# Patient Record
Sex: Female | Born: 1970 | Race: White | Hispanic: No | Marital: Married | Smoking: Current every day smoker
Health system: Southern US, Community
[De-identification: ages and names within clinical notes are randomized; demographics above are authoritative.]

## PROBLEM LIST (undated history)

## (undated) DIAGNOSIS — R002 Palpitations: Secondary | ICD-10-CM

## (undated) DIAGNOSIS — J302 Other seasonal allergic rhinitis: Secondary | ICD-10-CM

## (undated) HISTORY — PX: CHOLECYSTECTOMY: SHX55

---

## 2006-04-14 ENCOUNTER — Ambulatory Visit: Payer: Self-pay | Admitting: Family Medicine

## 2006-06-15 ENCOUNTER — Ambulatory Visit: Payer: Self-pay | Admitting: Family Medicine

## 2006-10-11 ENCOUNTER — Emergency Department: Payer: Self-pay | Admitting: Emergency Medicine

## 2008-09-07 ENCOUNTER — Emergency Department: Payer: Self-pay | Admitting: Emergency Medicine

## 2009-03-07 ENCOUNTER — Ambulatory Visit: Payer: Self-pay | Admitting: Family Medicine

## 2009-05-06 ENCOUNTER — Ambulatory Visit: Payer: Self-pay | Admitting: Podiatry

## 2010-02-08 ENCOUNTER — Emergency Department: Payer: Self-pay | Admitting: Emergency Medicine

## 2010-02-17 ENCOUNTER — Ambulatory Visit: Payer: Self-pay | Admitting: Family Medicine

## 2011-08-27 ENCOUNTER — Ambulatory Visit: Payer: Self-pay | Admitting: Family Medicine

## 2011-08-31 ENCOUNTER — Ambulatory Visit: Payer: Self-pay | Admitting: Family Medicine

## 2011-08-31 DIAGNOSIS — I519 Heart disease, unspecified: Secondary | ICD-10-CM

## 2012-06-23 ENCOUNTER — Ambulatory Visit: Payer: Self-pay | Admitting: Internal Medicine

## 2012-12-11 ENCOUNTER — Ambulatory Visit: Payer: Self-pay | Admitting: Family Medicine

## 2012-12-12 ENCOUNTER — Ambulatory Visit: Payer: Self-pay | Admitting: Family Medicine

## 2013-03-21 ENCOUNTER — Ambulatory Visit: Payer: Self-pay | Admitting: Family Medicine

## 2013-05-31 ENCOUNTER — Ambulatory Visit: Payer: Self-pay | Admitting: Family Medicine

## 2013-10-16 ENCOUNTER — Ambulatory Visit: Payer: Self-pay | Admitting: Family Medicine

## 2014-05-21 ENCOUNTER — Ambulatory Visit: Payer: Self-pay | Admitting: Family Medicine

## 2016-03-11 ENCOUNTER — Encounter: Payer: Self-pay | Admitting: *Deleted

## 2016-03-15 ENCOUNTER — Ambulatory Visit: Payer: BLUE CROSS/BLUE SHIELD | Admitting: Anesthesiology

## 2016-03-15 ENCOUNTER — Encounter: Payer: Self-pay | Admitting: Anesthesiology

## 2016-03-15 ENCOUNTER — Ambulatory Visit
Admission: RE | Admit: 2016-03-15 | Discharge: 2016-03-15 | Disposition: A | Discharge status: Home / Self Care | Payer: BLUE CROSS/BLUE SHIELD | Source: Ambulatory Visit | Attending: Gastroenterology | Admitting: Gastroenterology

## 2016-03-15 ENCOUNTER — Encounter
Admission: RE | Disposition: A | Discharge status: Home / Self Care | Payer: Self-pay | Source: Ambulatory Visit | Attending: Gastroenterology

## 2016-03-15 DIAGNOSIS — Z885 Allergy status to narcotic agent status: Secondary | ICD-10-CM | POA: Insufficient documentation

## 2016-03-15 DIAGNOSIS — R1084 Generalized abdominal pain: Secondary | ICD-10-CM | POA: Diagnosis present

## 2016-03-15 DIAGNOSIS — D120 Benign neoplasm of cecum: Secondary | ICD-10-CM | POA: Insufficient documentation

## 2016-03-15 DIAGNOSIS — F17200 Nicotine dependence, unspecified, uncomplicated: Secondary | ICD-10-CM | POA: Insufficient documentation

## 2016-03-15 DIAGNOSIS — Z880 Allergy status to penicillin: Secondary | ICD-10-CM | POA: Insufficient documentation

## 2016-03-15 DIAGNOSIS — Z79899 Other long term (current) drug therapy: Secondary | ICD-10-CM | POA: Insufficient documentation

## 2016-03-15 DIAGNOSIS — R002 Palpitations: Secondary | ICD-10-CM | POA: Insufficient documentation

## 2016-03-15 DIAGNOSIS — K208 Other esophagitis: Secondary | ICD-10-CM | POA: Diagnosis not present

## 2016-03-15 DIAGNOSIS — K5730 Diverticulosis of large intestine without perforation or abscess without bleeding: Secondary | ICD-10-CM | POA: Diagnosis not present

## 2016-03-15 DIAGNOSIS — K2960 Other gastritis without bleeding: Secondary | ICD-10-CM | POA: Diagnosis not present

## 2016-03-15 DIAGNOSIS — K640 First degree hemorrhoids: Secondary | ICD-10-CM | POA: Diagnosis not present

## 2016-03-15 HISTORY — PX: ESOPHAGOGASTRODUODENOSCOPY (EGD) WITH PROPOFOL: SHX5813

## 2016-03-15 HISTORY — PX: COLONOSCOPY WITH PROPOFOL: SHX5780

## 2016-03-15 HISTORY — DX: Palpitations: R00.2

## 2016-03-15 HISTORY — DX: Other seasonal allergic rhinitis: J30.2

## 2016-03-15 LAB — POCT PREGNANCY, URINE: Preg Test, Ur: NEGATIVE

## 2016-03-15 SURGERY — COLONOSCOPY WITH PROPOFOL
Anesthesia: General

## 2016-03-15 MED ORDER — SODIUM CHLORIDE 0.9 % IV SOLN
INTRAVENOUS | Status: DC
  Administered 2016-03-15 (×2): via INTRAVENOUS

## 2016-03-15 MED ORDER — SODIUM CHLORIDE 0.9 % IV SOLN: INTRAVENOUS | Status: DC

## 2016-03-15 MED ORDER — PHENYLEPHRINE HCL 10 MG/ML IJ SOLN
INTRAMUSCULAR | Status: DC | PRN
  Administered 2016-03-15 (×3): 100 ug via INTRAVENOUS

## 2016-03-15 MED ORDER — PROPOFOL 500 MG/50ML IV EMUL
INTRAVENOUS | Status: DC | PRN
  Administered 2016-03-15: 180 ug/kg/min via INTRAVENOUS

## 2016-03-15 MED ORDER — MIDAZOLAM HCL 2 MG/2ML IJ SOLN
INTRAMUSCULAR | Status: DC | PRN
  Administered 2016-03-15: 2 mg via INTRAVENOUS

## 2016-03-15 MED ORDER — PROPOFOL 10 MG/ML IV BOLUS
INTRAVENOUS | Status: DC | PRN
  Administered 2016-03-15 (×2): 50 mg via INTRAVENOUS

## 2016-03-15 MED ORDER — LIDOCAINE HCL (CARDIAC) 20 MG/ML IV SOLN
INTRAVENOUS | Status: DC | PRN
  Administered 2016-03-15: 100 mg via INTRAVENOUS

## 2016-03-15 MED ORDER — FENTANYL CITRATE (PF) 100 MCG/2ML IJ SOLN
25.0000 ug | INTRAMUSCULAR | Status: DC | PRN
  Administered 2016-03-15: 50 ug via INTRAVENOUS

## 2016-03-15 MED ORDER — ONDANSETRON HCL 4 MG/2ML IJ SOLN: 4.0000 mg | Freq: Once | INTRAMUSCULAR | Status: DC | PRN

## 2016-03-15 NOTE — Transfer of Care (Signed)
Immediate Anesthesia Transfer of Care Note  Patient: Katie Cisneros  Procedure(s) Performed: Procedure(s): COLONOSCOPY WITH PROPOFOL (N/A) ESOPHAGOGASTRODUODENOSCOPY (EGD) WITH PROPOFOL (N/A)  Patient Location: PACU  Anesthesia Type:General  Level of Consciousness: sedated  Airway & Oxygen Therapy: Patient Spontanous Breathing and Patient connected to nasal cannula oxygen  Post-op Assessment: Report given to RN and Post -op Vital signs reviewed and stable  Post vital signs: Reviewed and stable  Last Vitals:  Vitals:   03/15/16 0942 03/15/16 1117  BP: 120/83 (!) 86/56  Pulse: (!) 115 86  Resp: 17 (!) 24  Temp: 37.1 C 36.2 C    Last Pain:  Vitals:   03/15/16 0942  TempSrc: Oral         Complications: No apparent anesthesia complications

## 2016-03-15 NOTE — Anesthesia Preprocedure Evaluation (Addendum)
Anesthesia Evaluation  Patient identified by MRN, date of birth, ID band Patient awake    Reviewed: Allergy & Precautions, NPO status , Patient's Chart, lab work & pertinent test results  Airway Mallampati: II  TM Distance: <3 FB     Dental no notable dental hx.  Fillings:   Pulmonary Current Smoker,  Seasonal allergies   Pulmonary exam normal        Cardiovascular negative cardio ROS Normal cardiovascular exam+ dysrhythmias      Neuro/Psych negative neurological ROS  negative psych ROS   GI/Hepatic negative GI ROS, Neg liver ROS,   Endo/Other  negative endocrine ROS  Renal/GU negative Renal ROS  negative genitourinary   Musculoskeletal negative musculoskeletal ROS (+)   Abdominal Normal abdominal exam  (+)   Peds negative pediatric ROS (+)  Hematology negative hematology ROS (+)   Anesthesia Other Findings   Reproductive/Obstetrics                            Anesthesia Physical Anesthesia Plan  ASA: II  Anesthesia Plan: General   Post-op Pain Management:    Induction: Intravenous  Airway Management Planned: Nasal Cannula  Additional Equipment:   Intra-op Plan:   Post-operative Plan:   Informed Consent: I have reviewed the patients History and Physical, chart, labs and discussed the procedure including the risks, benefits and alternatives for the proposed anesthesia with the patient or authorized representative who has indicated his/her understanding and acceptance.   Dental advisory given  Plan Discussed with: CRNA and Surgeon  Anesthesia Plan Comments:         Anesthesia Quick Evaluation

## 2016-03-15 NOTE — H&P (Signed)
Outpatient short stay form Pre-procedure 03/15/2016 10:08 AM Lollie Sails MD  Primary Physician: Dr. Clemmie Krill  Reason for visit:  EGD and colonoscopy  History of present illness:  Patient is a 45 year old female presenting today with multiple complaints of generalized abdominal pain bloating, early satiety, he is also seeing some rectal bleeding. This is at times enough to change color of 12. His also had some bowel habit changes with intermittent looser stools. There is also been some nausea and particularly epigastric/left upper quadrant pain. She was prescribed a PPI however has not been taking that. She tolerated her prep well. She denies the regular use of aspirin or NSAID products. She takes no blood thinning agents.    Current Facility-Administered Medications:  .  0.9 %  sodium chloride infusion, , Intravenous, Continuous, Lollie Sails, MD, Last Rate: 20 mL/hr at 03/15/16 1001 .  0.9 %  sodium chloride infusion, , Intravenous, Continuous, Lollie Sails, MD .  fentaNYL (SUBLIMAZE) injection 25 mcg, 25 mcg, Intravenous, Q5 min PRN, Alvin Critchley, MD .  ondansetron [facility]) injection 4 mg, 4 mg, Intravenous, Once PRN, Alvin Critchley, MD  Prescriptions Prior to Admission  Medication Sig Dispense Refill Last Dose  . fexofenadine-pseudoephedrine (ALLEGRA-D 24) 180-240 MG 24 hr tablet Take 1 tablet by mouth daily.     . hydrochlorothiazide (HYDRODIURIL) 25 MG tablet Take 25 mg by mouth daily.     . montelukast (SINGULAIR) 10 MG tablet Take 10 mg by mouth at bedtime.     . Multiple Vitamins-Minerals (ALIVE WOMENS GUMMY PO) Take by mouth.     . pantoprazole (PROTONIX) 40 MG tablet Take 40 mg by mouth daily.        Allergies  Allergen Reactions  . Augmentin [Amoxicillin-Pot Clavulanate] Anaphylaxis  . Hydrocodone Itching  . Oxycodone Itching  . Sulfa Antibiotics Hives     Past Medical History:  Diagnosis Date  . Palpitations   . Seasonal allergies     Review of  systems:      Physical Exam    Heart and lungs: Regular rate and rhythm without rub or gallop, lungs are bilaterally clear.    HEENT: Normocephalic atraumatic eyes are anicteric    Other:     Pertinant exam for procedure: Soft, mild discomfort to palpation throughout the abdomen. No masses or rebound. Sounds are positive normoactive. No distention.    Planned proceedures: EGD, colonoscopy and indicated procedures. I have discussed the risks benefits and complications of procedures to include not limited to bleeding, infection, perforation and the risk of sedation and the patient wishes to proceed.    Lollie Sails, MD Gastroenterology 03/15/2016  10:08 AM

## 2016-03-15 NOTE — Op Note (Signed)
[facility] Gastroenterology Patient Name: Clotilda Maciolek Procedure Date: 03/15/2016 10:09 AM MRN: [id number] Account #: [id number] Date of Birth: [birth year] Admit Type: Outpatient Age: 45 Room: [facility] ENDO ROOM 3 Gender: Female Note Status: Finalized Procedure:            Colonoscopy Indications:          Generalized abdominal pain, Change in bowel habits Providers:            Lollie Sails, MD Referring MD:         Lynnell Jude (Referring MD) Medicines:            Monitored Anesthesia Care Complications:        No immediate complications. Procedure:            Pre-Anesthesia Assessment:                       - ASA Grade Assessment: II - A patient with mild                        systemic disease.                       After obtaining informed consent, the colonoscope was                        passed under direct vision. Throughout the procedure,                        the patient's blood pressure, pulse, and oxygen                        saturations were monitored continuously. The                        Colonoscope was introduced through the anus and                        advanced to the the cecum, identified by appendiceal                        orifice and ileocecal valve. The colonoscopy was                        performed with moderate difficulty. Successful                        completion of the procedure was aided by changing the                        patient to a supine position and using manual pressure.                        The patient tolerated the procedure well. The quality                        of the bowel preparation was good. Findings:      A 1 mm polyp was found in the cecum. The polyp was flat. The polyp was       removed with a cold biopsy forceps. Resection and retrieval were  complete.      Multiple small-mouthed diverticula were found in the sigmoid colon,       descending colon, transverse colon and ascending colon.      Biopsies for histology were taken with a cold forceps from the right       colon and left colon for evaluation of microscopic colitis.      Non-bleeding internal hemorrhoids were found during anoscopy. The       hemorrhoids were small and Grade I (internal hemorrhoids that do not       prolapse).      The digital rectal exam was normal. Impression:           - One 1 mm polyp in the cecum, removed with a cold                        biopsy forceps. Resected and retrieved.                       - Diverticulosis in the sigmoid colon, in the                        descending colon, in the transverse colon and in the                        ascending colon.                       - Non-bleeding internal hemorrhoids.                       - Biopsies were taken with a cold forceps from the                        right colon and left colon for evaluation of                        microscopic colitis. Recommendation:       - Discharge patient to home.                       - Await pathology results.                       - Return to GI clinic in 4 weeks. Procedure Code(s):    --- Professional ---                       [phone], Colonoscopy, flexible; with biopsy, single or                        multiple Diagnosis Code(s):    --- Professional ---                       D12.0, Benign neoplasm of cecum                       K64.0, First degree hemorrhoids                       R10.84, Generalized abdominal pain                       R19.4, Change  in bowel habit                       K57.30, Diverticulosis of large intestine without                        perforation or abscess without bleeding CPT copyright 2016 [employer]. All rights reserved. The codes documented in this report are preliminary and upon coder review may  be revised to meet current compliance requirements. Lollie Sails, MD 03/15/2016 11:14:23 AM This report has been signed electronically. Number of Addenda:  0 Note Initiated On: 03/15/2016 10:09 AM Scope Withdrawal Time: 0 hours 7 minutes 41 seconds  Total Procedure Duration: 0 hours 23 minutes 30 seconds       [facility]

## 2016-03-15 NOTE — Op Note (Signed)
[facility] Gastroenterology Patient Name: Katie Cisneros Procedure Date: 03/15/2016 10:10 AM MRN: [id number] Account #: [id number] Date of Birth: 09-11-1970 Admit Type: Outpatient Age: 45 Room: [facility] ENDO ROOM 3 Gender: Female Note Status: Finalized Procedure:            Upper GI endoscopy Indications:          Epigastric abdominal pain, Abdominal pain in the left                        upper quadrant, Generalized abdominal pain Providers:            Lollie Sails, MD Referring MD:         Lynnell Jude (Referring MD) Medicines:            Monitored Anesthesia Care Complications:        No immediate complications. Procedure:            Pre-Anesthesia Assessment:                       - ASA Grade Assessment: II - A patient with mild                        systemic disease.                       After obtaining informed consent, the endoscope was                        passed under direct vision. Throughout the procedure,                        the patient's blood pressure, pulse, and oxygen                        saturations were monitored continuously. The Endoscope                        was introduced through the mouth, and advanced to the                        third part of duodenum. The upper GI endoscopy was                        accomplished without difficulty. The patient tolerated                        the procedure well. Findings:      LA Grade C (one or more mucosal breaks continuous between tops of 2 or       more mucosal folds, less than 75% circumference) esophagitis with no       bleeding was found. Biopsies were taken with a cold forceps for       histology.      Diffuse mild inflammation characterized by adherent blood, congestion       (edema) and erythema was found in the gastric body and in the gastric       antrum. Biopsies were taken with a cold forceps for histology.      The cardia and gastric fundus were normal on retroflexion.      The examined duodenum was normal. Impression:           -  LA Grade C erosive esophagitis. Biopsied.                       - Bile gastritis. Biopsied.                       - Normal examined duodenum. Recommendation:       - Use Protonix (pantoprazole) 40 mg PO daily daily.                       - Use sucralfate tablets 1 gram PO QID daily.                       - Await pathology results. Procedure Code(s):    --- Professional ---                       [phone], Esophagogastroduodenoscopy, flexible, transoral;                        with biopsy, single or multiple Diagnosis Code(s):    --- Professional ---                       K20.8, Other esophagitis                       K29.60, Other gastritis without bleeding                       R10.13, Epigastric pain                       R10.12, Left upper quadrant pain                       R10.84, Generalized abdominal pain CPT copyright 2016 American Medical Association. All rights reserved. The codes documented in this report are preliminary and upon coder review may  be revised to meet current compliance requirements. Lollie Sails, MD 03/15/2016 10:40:29 AM This report has been signed electronically. Number of Addenda: 0 Note Initiated On: 03/15/2016 10:10 AM      [facility]

## 2016-03-15 NOTE — Anesthesia Procedure Notes (Signed)
Date/Time: 03/15/2016 10:10 AM Performed by: Allean Found Pre-anesthesia Checklist: Patient identified, Emergency Drugs available, Suction available, Patient being monitored and Timeout performed Patient Re-evaluated:Patient Re-evaluated prior to inductionOxygen Delivery Method: Nasal cannula Intubation Type: IV induction

## 2016-03-15 NOTE — Anesthesia Postprocedure Evaluation (Signed)
Anesthesia Post Note  Patient: Katie Cisneros  Procedure(s) Performed: Procedure(s) (LRB): COLONOSCOPY WITH PROPOFOL (N/A) ESOPHAGOGASTRODUODENOSCOPY (EGD) WITH PROPOFOL (N/A)  Patient location during evaluation: PACU Anesthesia Type: General Level of consciousness: awake and alert and oriented Pain management: pain level controlled Vital Signs Assessment: post-procedure vital signs reviewed and stable Respiratory status: spontaneous breathing Cardiovascular status: blood pressure returned to baseline Anesthetic complications: no    Last Vitals:  Vitals:   03/15/16 1145 03/15/16 1155  BP: 97/64 98/62  Pulse:    Resp:    Temp:      Last Pain:  Vitals:   03/15/16 0942  TempSrc: Oral                 Amena Dockham

## 2016-03-16 ENCOUNTER — Encounter: Payer: Self-pay | Admitting: Gastroenterology

## 2016-03-17 LAB — SURGICAL PATHOLOGY

## 2016-03-31 ENCOUNTER — Other Ambulatory Visit: Payer: Self-pay | Admitting: Gastroenterology

## 2016-03-31 DIAGNOSIS — R109 Unspecified abdominal pain: Secondary | ICD-10-CM

## 2016-03-31 DIAGNOSIS — R1012 Left upper quadrant pain: Secondary | ICD-10-CM

## 2016-03-31 DIAGNOSIS — R1013 Epigastric pain: Secondary | ICD-10-CM

## 2016-04-01 ENCOUNTER — Encounter (INDEPENDENT_AMBULATORY_CARE_PROVIDER_SITE_OTHER): Payer: Self-pay

## 2016-04-01 ENCOUNTER — Ambulatory Visit
Admission: RE | Admit: 2016-04-01 | Discharge: 2016-04-01 | Disposition: A | Discharge status: Home / Self Care | Payer: BLUE CROSS/BLUE SHIELD | Source: Ambulatory Visit | Attending: Gastroenterology | Admitting: Gastroenterology

## 2016-04-01 DIAGNOSIS — Z9889 Other specified postprocedural states: Secondary | ICD-10-CM | POA: Insufficient documentation

## 2016-04-01 DIAGNOSIS — R109 Unspecified abdominal pain: Secondary | ICD-10-CM | POA: Diagnosis present

## 2016-04-01 DIAGNOSIS — R1012 Left upper quadrant pain: Secondary | ICD-10-CM

## 2016-04-01 DIAGNOSIS — K760 Fatty (change of) liver, not elsewhere classified: Secondary | ICD-10-CM | POA: Diagnosis not present

## 2016-04-01 DIAGNOSIS — R1013 Epigastric pain: Secondary | ICD-10-CM

## 2016-04-01 MED ORDER — IOPAMIDOL (ISOVUE-300) INJECTION 61%
100.0000 mL | Freq: Once | INTRAVENOUS | Status: AC | PRN
  Administered 2016-04-01: 100 mL via INTRAVENOUS

## 2016-04-12 ENCOUNTER — Other Ambulatory Visit: Payer: Self-pay | Admitting: Family Medicine

## 2016-04-12 DIAGNOSIS — N6453 Retraction of nipple: Secondary | ICD-10-CM

## 2016-05-12 ENCOUNTER — Other Ambulatory Visit: Payer: Self-pay | Admitting: Family Medicine

## 2016-05-12 ENCOUNTER — Ambulatory Visit
Admission: RE | Admit: 2016-05-12 | Discharge: 2016-05-12 | Disposition: A | Discharge status: Home / Self Care | Payer: BLUE CROSS/BLUE SHIELD | Source: Ambulatory Visit | Attending: Family Medicine | Admitting: Family Medicine

## 2016-05-12 DIAGNOSIS — W109XXA Fall (on) (from) unspecified stairs and steps, initial encounter: Secondary | ICD-10-CM | POA: Diagnosis not present

## 2016-05-12 DIAGNOSIS — S300XXA Contusion of lower back and pelvis, initial encounter: Secondary | ICD-10-CM | POA: Insufficient documentation

## 2016-05-12 DIAGNOSIS — R52 Pain, unspecified: Secondary | ICD-10-CM

## 2017-07-31 IMAGING — CT CT ABD-PELV W/ CM
1 of 3 series · 14 of 32 positions shown, 19 images · IV contrast (iopamidol)
Comparison: None.

CLINICAL DATA: Left upper quadrant and epigastric pain.

EXAM:
CT ABDOMEN AND PELVIS WITH CONTRAST
TECHNIQUE: Multidetector CT imaging of the abdomen and pelvis was performed
using the standard protocol following bolus administration of
intravenous contrast.
CONTRAST:  100mL KOTFT8-S44 IOPAMIDOL (KOTFT8-S44) INJECTION 61%

[Series 2: axial st · axial · 0.79mm/px · z∈[-1024,-568]mm · 14 of 103 slices shown, 19 images]
[im 6/103  soft-tissue]
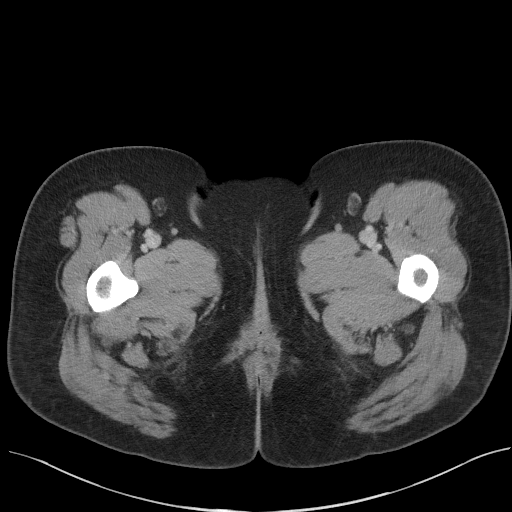
[im 6/103  bone]
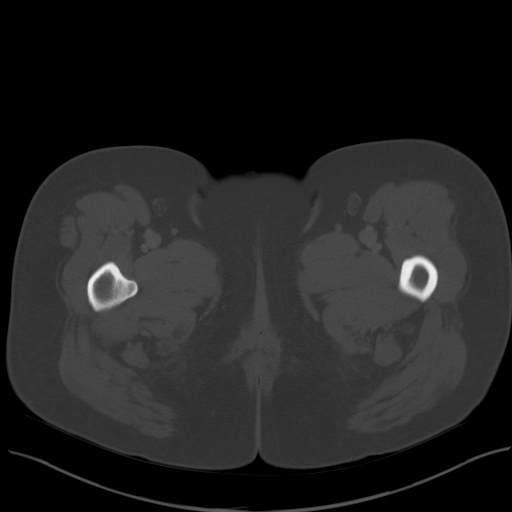
[im 12/103  soft-tissue]
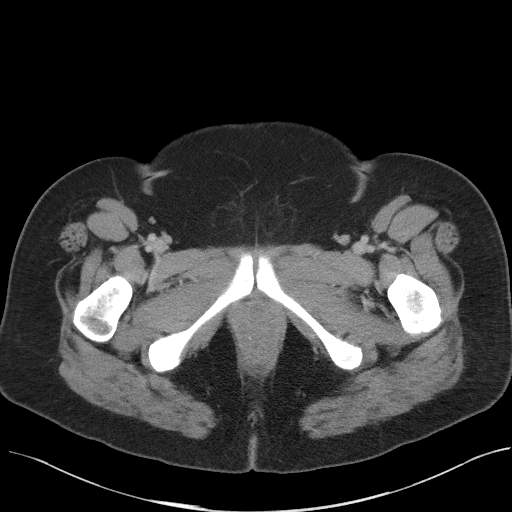
[im 23/103  soft-tissue]
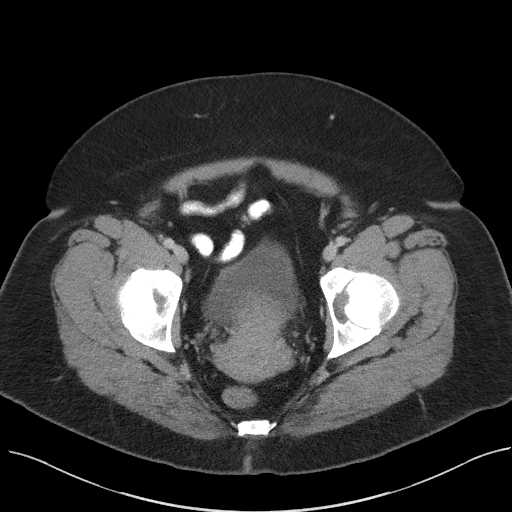
[im 29/103  soft-tissue]
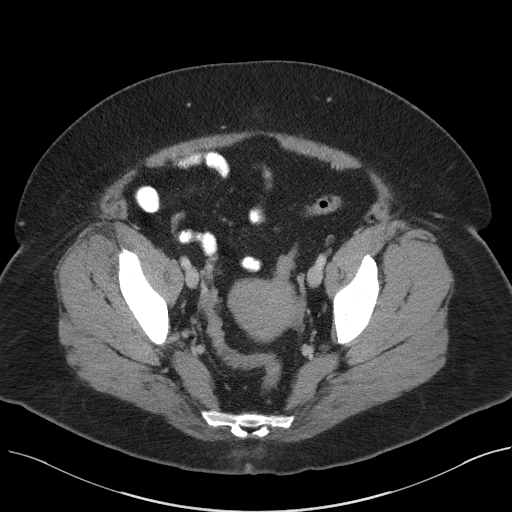
[im 35/103  soft-tissue]
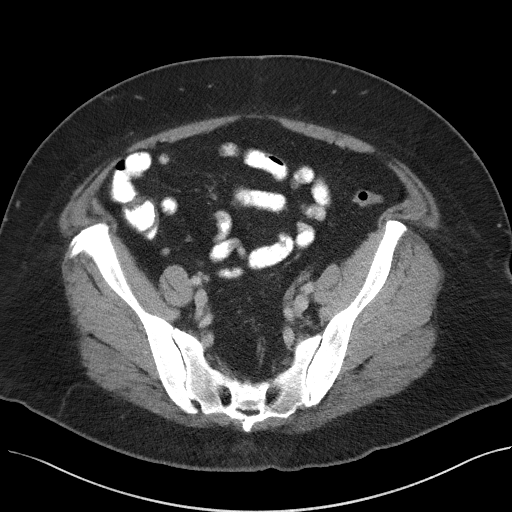
[im 46/103  soft-tissue]
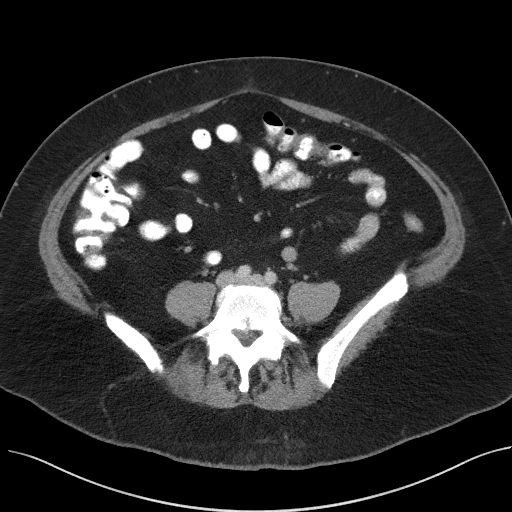
[im 52/103  soft-tissue]
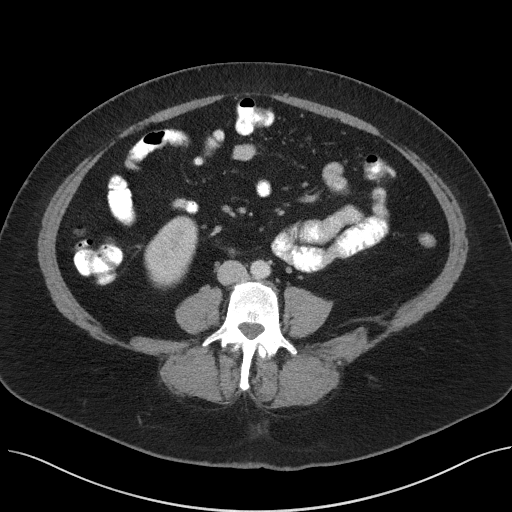
[im 57/103  soft-tissue]
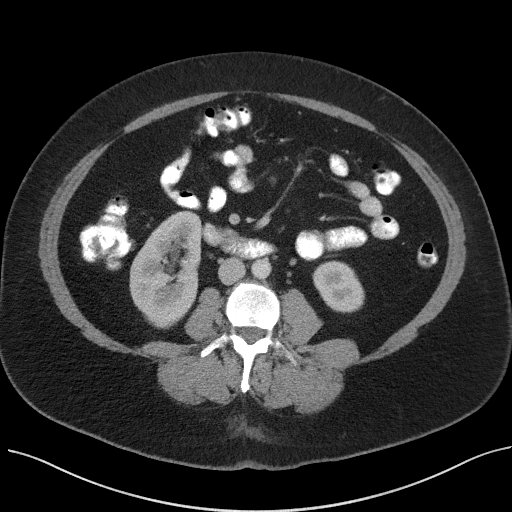
[im 69/103  soft-tissue]
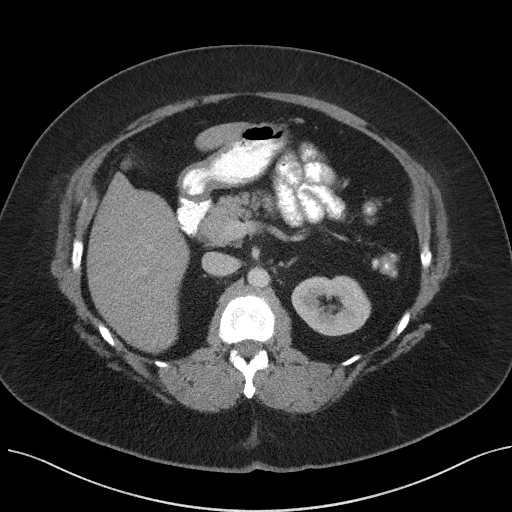
[im 69/103  bone]
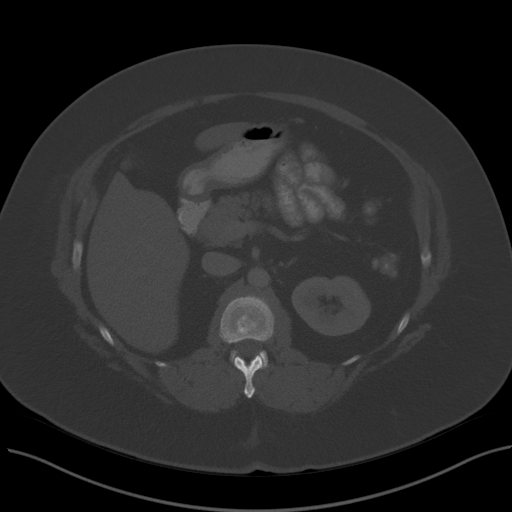
[im 74/103  soft-tissue]
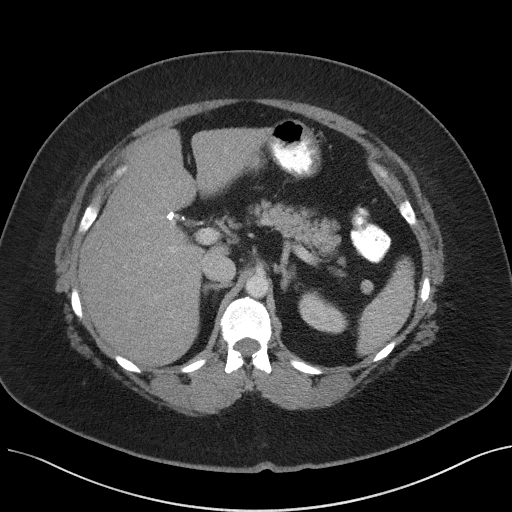
[im 80/103  soft-tissue]
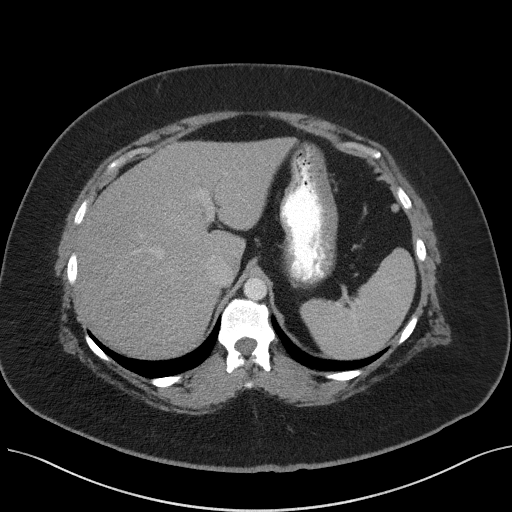
[im 80/103  lung]
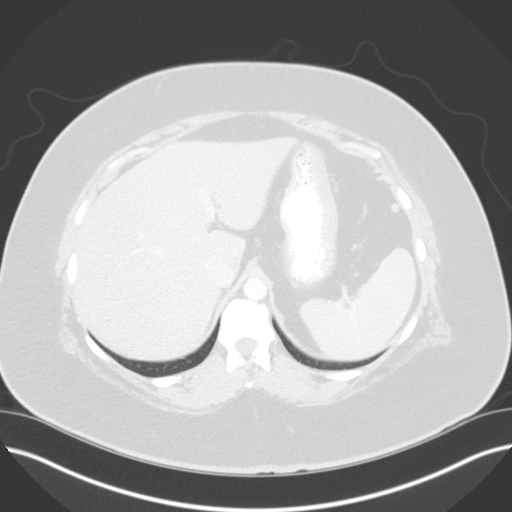
[im 86/103  lung]
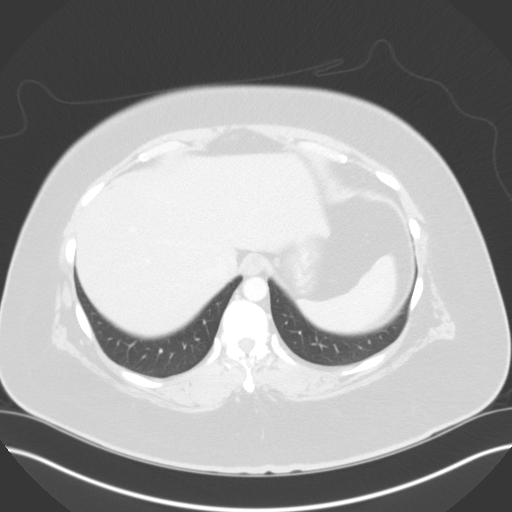
[im 91/103  soft-tissue]
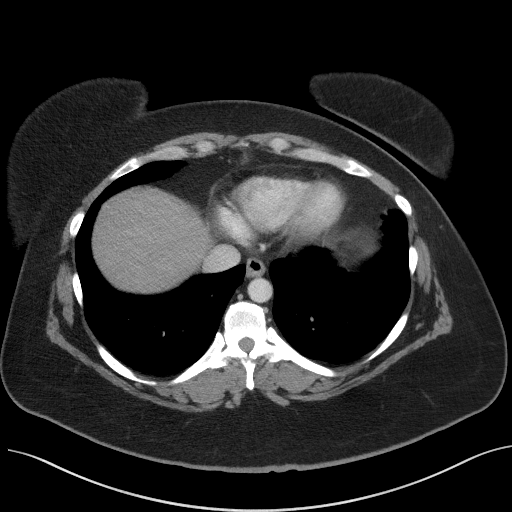
[im 91/103  lung]
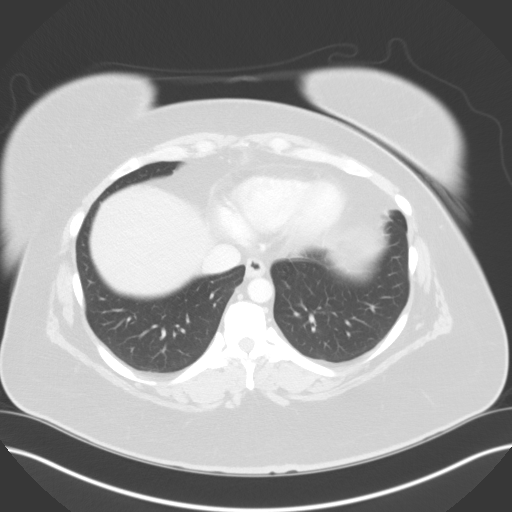
[im 97/103  soft-tissue]
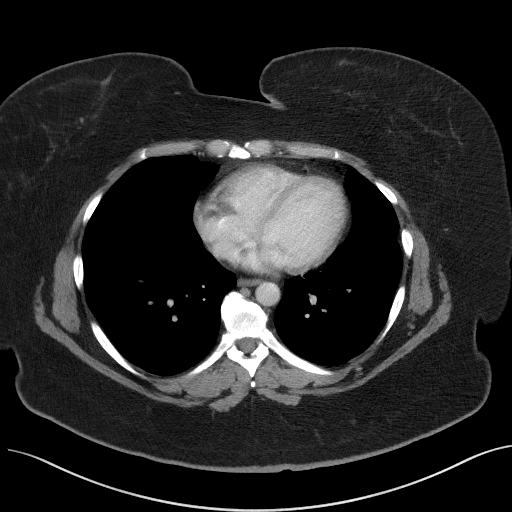
[im 97/103  lung]
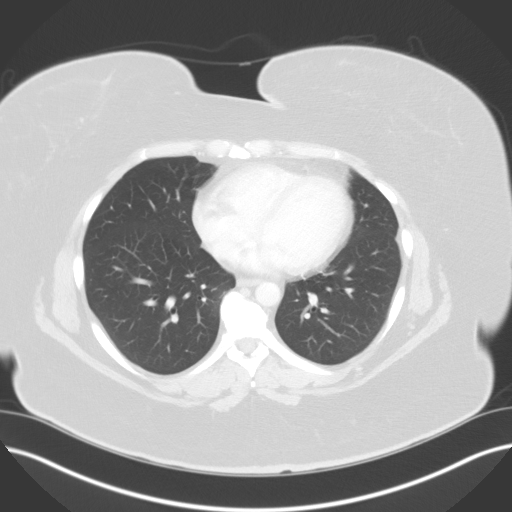

[14 of 32 positions shown; findings below may reference images not displayed]

FINDINGS: Lower chest: The lung bases are clear. No pleural or pericardial
effusion noted.

Hepatobiliary: Diffuse hepatic steatosis. Previous cholecystectomy.
No biliary dilatation.

Pancreas: No mass, inflammatory changes, or other significant
abnormality.

Spleen: Within normal limits in size and appearance.

Adrenals/Urinary Tract: The adrenal glands are normal. Unremarkable
appearance of both kidneys. No mass or hydronephrosis.

Stomach/Bowel: No evidence of obstruction, inflammatory process, or
abnormal fluid collections.

Vascular/Lymphatic: No pathologically enlarged lymph nodes. No
evidence of abdominal aortic aneurysm.

Reproductive: No mass or other significant abnormality.

Other: There is no ascites or focal fluid collections within the
abdomen or pelvis.

Musculoskeletal: Degenerative disc disease is noted within the
lumbar spine.
IMPRESSION: 1. No acute findings within the abdomen or pelvis.
2. Previous coli cystectomy.
3. Hepatic steatosis.

## 2017-12-19 ENCOUNTER — Other Ambulatory Visit: Payer: Self-pay | Admitting: Physical Medicine and Rehabilitation

## 2017-12-19 DIAGNOSIS — M5416 Radiculopathy, lumbar region: Secondary | ICD-10-CM

## 2017-12-29 ENCOUNTER — Encounter (INDEPENDENT_AMBULATORY_CARE_PROVIDER_SITE_OTHER): Payer: Self-pay

## 2017-12-29 ENCOUNTER — Ambulatory Visit
Admission: RE | Admit: 2017-12-29 | Discharge: 2017-12-29 | Disposition: A | Discharge status: Home / Self Care | Payer: BLUE CROSS/BLUE SHIELD | Source: Ambulatory Visit | Attending: Physical Medicine and Rehabilitation | Admitting: Physical Medicine and Rehabilitation

## 2017-12-29 DIAGNOSIS — M5136 Other intervertebral disc degeneration, lumbar region: Secondary | ICD-10-CM | POA: Diagnosis not present

## 2017-12-29 DIAGNOSIS — M5416 Radiculopathy, lumbar region: Secondary | ICD-10-CM

## 2017-12-29 DIAGNOSIS — M48061 Spinal stenosis, lumbar region without neurogenic claudication: Secondary | ICD-10-CM | POA: Insufficient documentation

## 2017-12-29 DIAGNOSIS — M5126 Other intervertebral disc displacement, lumbar region: Secondary | ICD-10-CM | POA: Insufficient documentation

## 2018-02-24 ENCOUNTER — Other Ambulatory Visit: Payer: Self-pay | Admitting: Obstetrics and Gynecology

## 2018-02-24 DIAGNOSIS — Z1231 Encounter for screening mammogram for malignant neoplasm of breast: Secondary | ICD-10-CM

## 2018-02-27 ENCOUNTER — Ambulatory Visit
Admission: RE | Admit: 2018-02-27 | Discharge: 2018-02-27 | Disposition: A | Discharge status: Home / Self Care | Payer: BLUE CROSS/BLUE SHIELD | Source: Ambulatory Visit | Attending: Obstetrics and Gynecology | Admitting: Obstetrics and Gynecology

## 2018-02-27 DIAGNOSIS — Z1231 Encounter for screening mammogram for malignant neoplasm of breast: Secondary | ICD-10-CM | POA: Insufficient documentation

## 2018-03-02 ENCOUNTER — Ambulatory Visit: Payer: BLUE CROSS/BLUE SHIELD

## 2018-07-18 ENCOUNTER — Other Ambulatory Visit: Payer: Self-pay

## 2018-07-18 ENCOUNTER — Other Ambulatory Visit: Payer: Self-pay | Admitting: Physical Medicine and Rehabilitation

## 2018-07-18 DIAGNOSIS — M5412 Radiculopathy, cervical region: Secondary | ICD-10-CM

## 2018-07-19 ENCOUNTER — Ambulatory Visit
Admission: RE | Admit: 2018-07-19 | Discharge: 2018-07-19 | Disposition: A | Discharge status: Home / Self Care | Payer: BLUE CROSS/BLUE SHIELD | Source: Ambulatory Visit | Attending: Physical Medicine and Rehabilitation | Admitting: Physical Medicine and Rehabilitation

## 2018-07-19 DIAGNOSIS — M4802 Spinal stenosis, cervical region: Secondary | ICD-10-CM | POA: Insufficient documentation

## 2018-07-19 DIAGNOSIS — M5412 Radiculopathy, cervical region: Secondary | ICD-10-CM | POA: Diagnosis present

## 2019-06-11 ENCOUNTER — Other Ambulatory Visit: Payer: Self-pay | Admitting: Physical Medicine and Rehabilitation

## 2019-06-11 DIAGNOSIS — M5412 Radiculopathy, cervical region: Secondary | ICD-10-CM

## 2019-06-18 ENCOUNTER — Ambulatory Visit
Admission: RE | Admit: 2019-06-18 | Discharge: 2019-06-18 | Disposition: A | Discharge status: Home / Self Care | Payer: BC Managed Care – PPO | Source: Ambulatory Visit | Attending: Physical Medicine and Rehabilitation | Admitting: Physical Medicine and Rehabilitation

## 2019-06-18 ENCOUNTER — Other Ambulatory Visit: Payer: Self-pay

## 2019-06-18 DIAGNOSIS — M5412 Radiculopathy, cervical region: Secondary | ICD-10-CM | POA: Diagnosis not present

## 2019-06-27 ENCOUNTER — Other Ambulatory Visit: Payer: Self-pay | Admitting: Neurology

## 2019-06-27 DIAGNOSIS — G35 Multiple sclerosis: Secondary | ICD-10-CM

## 2019-07-11 ENCOUNTER — Other Ambulatory Visit: Payer: Self-pay

## 2019-07-11 ENCOUNTER — Ambulatory Visit
Admission: RE | Admit: 2019-07-11 | Discharge: 2019-07-11 | Disposition: A | Discharge status: Home / Self Care | Payer: BC Managed Care – PPO | Source: Ambulatory Visit | Attending: Neurology | Admitting: Neurology

## 2019-07-11 DIAGNOSIS — G35 Multiple sclerosis: Secondary | ICD-10-CM | POA: Diagnosis not present

## 2020-02-20 ENCOUNTER — Other Ambulatory Visit: Payer: Self-pay | Admitting: Family Medicine

## 2020-02-20 DIAGNOSIS — R1011 Right upper quadrant pain: Secondary | ICD-10-CM

## 2021-03-31 ENCOUNTER — Other Ambulatory Visit: Payer: Self-pay | Admitting: Family Medicine

## 2021-03-31 DIAGNOSIS — N644 Mastodynia: Secondary | ICD-10-CM

## 2024-08-08 ENCOUNTER — Other Ambulatory Visit: Payer: Self-pay | Admitting: Family Medicine

## 2024-08-08 DIAGNOSIS — Z7952 Long term (current) use of systemic steroids: Secondary | ICD-10-CM

## 2024-08-08 DIAGNOSIS — Z1231 Encounter for screening mammogram for malignant neoplasm of breast: Secondary | ICD-10-CM

## 2024-08-23 ENCOUNTER — Other Ambulatory Visit: Payer: Self-pay | Admitting: Family Medicine

## 2024-08-23 DIAGNOSIS — M5416 Radiculopathy, lumbar region: Secondary | ICD-10-CM

## 2024-08-23 DIAGNOSIS — M5412 Radiculopathy, cervical region: Secondary | ICD-10-CM

## 2024-09-03 ENCOUNTER — Other Ambulatory Visit

## 2024-09-11 ENCOUNTER — Other Ambulatory Visit

## 2024-09-13 ENCOUNTER — Ambulatory Visit
